# Patient Record
Sex: Male | Born: 1995 | Race: White | Hispanic: No | Marital: Single
Health system: Southern US, Community
[De-identification: ages and names within clinical notes are randomized; demographics above are authoritative.]

---
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
## 2018-03-02 ENCOUNTER — Other Ambulatory Visit: Payer: Self-pay | Admitting: Pediatrics

## 2018-03-02 ENCOUNTER — Ambulatory Visit
Admission: RE | Admit: 2018-03-02 | Discharge: 2018-03-02 | Disposition: A | Discharge status: Home / Self Care | Payer: 59 | Source: Ambulatory Visit | Attending: Pediatrics | Admitting: Pediatrics

## 2018-03-02 DIAGNOSIS — Z0000 Encounter for general adult medical examination without abnormal findings: Secondary | ICD-10-CM

## 2018-03-05 ENCOUNTER — Ambulatory Visit (INDEPENDENT_AMBULATORY_CARE_PROVIDER_SITE_OTHER): Payer: 59 | Admitting: Cardiovascular Disease

## 2018-03-05 VITALS — BP 130/79 | HR 69 | Ht 72.0 in | Wt 135.4 lb

## 2018-03-05 DIAGNOSIS — I459 Conduction disorder, unspecified: Secondary | ICD-10-CM | POA: Diagnosis not present

## 2018-03-05 DIAGNOSIS — R011 Cardiac murmur, unspecified: Secondary | ICD-10-CM | POA: Diagnosis not present

## 2018-03-05 NOTE — Patient Instructions (Signed)
Medication Instructions:  Your physician recommends that you continue on your current medications as directed. Please refer to the Current Medication list given to you today.  If you need a refill on your cardiac medications before your next appointment, please call your pharmacy.   Testing/Procedures: Your physician has requested that you have an echocardiogram. Echocardiography is a painless test that uses sound waves to create images of your heart. It provides your doctor with information about the size and shape of your heart and how well your heart's chambers and valves are working. This procedure takes approximately one hour. There are no restrictions for this procedure.  [facility]:  [facility]  Follow-Up: At [facility], you and your health needs are our priority.  As part of our continuing mission to provide you with exceptional heart care, we have created designated Provider Care Teams.  These Care Teams include your primary Cardiologist (physician) and Advanced Practice Providers (APPs -  Physician Assistants and Nurse Practitioners) who all work together to provide you with the care you need, when you need it. Marland Kitchen. Pending Echo results

## 2018-03-05 NOTE — Progress Notes (Signed)
Cardiology Office Note    Date:  03/06/2018   ID:  Garrett Russo, DOB 06/22/1995, MRN [id number]  PCP:  Bjorn Pippin, MD  Cardiologist:  Nicki Guadalajara, MD   New cardiology consultation referred through the courtesy of Dr. Anner Crete at [facility] pediatrics  History of Present Illness:  Garrett Russo is a 22 y.o. male who is referred through the courtesy of Dr. Vonna Kotyk for evaluation of recently detected cardiac murmur.  Garrett Russo is a 22 year old Caucasian male who denies any awareness of known cardiac disease.  However, [name] to his mother when he was younger doing sports on one occasion he appeared to have a syncopal spell.  He has noticed some episodes of some mild lightheadedness and has not associated any palpitations.  He had graduated from high school last year and was enrolled at [employer].  However, he recently came back home and will not complete the semester or return next semester until certain issues are worked out.  He has a history of vaping for approximately 2 years and while at school was using marijuana 4 times per week as well as having beer often.  Ultimately, the patient has left school to be at home for the next year prior to determining which direction he will be heading.  He was recently evaluated by Dr. Marcha Dutton at [facility] pediatrics and a 1/6 low pitched vibratory holosystolic murmur was heard best at the lower sternal border.  He is now referred for cardiology evaluation.  Remotely he had played football.  While in high school he played lacrosse.  He was able to run without significant difficulty.  He is unaware of any family history of cardiomyopathy or hypertrophic muscle.  He denies any palpitations.  Oftentimes when he felt dizzy he would be breathing rapidly and other times this occurred after periods of significant heat and perspiration suggesting potential dehydration component.  He presents for evaluation   No past medical history on  file.  No past surgical history on file.  Current Medications: Outpatient Medications Prior to Visit  Medication Sig Dispense Refill  . EPINEPHrine 0.3 mg/0.3 mL IJ SOAJ injection Inject into the muscle.    Marland Kitchen FLUoxetine (PROZAC) 20 MG capsule Take 20 mg by mouth daily.    . naproxen (NAPROSYN) 500 MG tablet Take 1 tablet (500 mg total) by mouth 2 (two) times daily. 30 tablet 0   No facility-administered medications prior to visit.      Allergies:   Bee venom  Apparently over the past year he was stung by a bee and developed potential early anaphylaxis and was treated at [facility].  He does not recall being told of having a cardiac murmur at that time.  Social History   Socioeconomic History  . Marital status: Single    Spouse name: Not on file  . Number of children: Not on file  . Years of education: Not on file  . Highest education level: Not on file  Occupational History  . Not on file  Social Needs  . Financial resource strain: Not on file  . Food insecurity:    Worry: Not on file    Inability: Not on file  . Transportation needs:    Medical: Not on file    Non-medical: Not on file  Tobacco Use  . Smoking status: Passive Smoke Exposure - Never Smoker  Substance and Sexual Activity  . Alcohol use: No  . Drug use: No  . Sexual activity: Not  on file  Lifestyle  . Physical activity:    Days per week: Not on file    Minutes per session: Not on file  . Stress: Not on file  Relationships  . Social connections:    Talks on phone: Not on file    Gets together: Not on file    Attends religious service: Not on file    Active member of club or organization: Not on file    Attends meetings of clubs or organizations: Not on file    Relationship status: Not on file  Other Topics Concern  . Not on file  Social History Narrative  . Not on file     Additional social history: He is single.  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  He has been vaping for 2 years.  He drinks beer.  Over the past year he was using marijuana 4 times per week.  There are no other drugs.  Family History:  The patient's family history is not on file.   Family history is notable in that his mother is age 20 and has a benign brain tumor.  His father is age 20.  A sister, age 20 was told of having a heart murmur at age 20.  ROS General: Negative; No fevers, chills, or night sweats;  HEENT: Negative; No changes in vision or hearing, sinus congestion, difficulty swallowing Pulmonary: Negative; No cough, wheezing, shortness of breath, hemoptysis Cardiovascular: Negative; No chest pain, presyncope, syncope, palpitations GI: Negative; No nausea, vomiting, diarrhea, or abdominal pain GU: Negative; No dysuria, hematuria, or difficulty voiding Musculoskeletal: Negative; no myalgias, joint pain, or weakness Hematologic/Oncology: Negative; no easy bruising, bleeding Endocrine: Negative; no heat/cold intolerance; no diabetes Neuro: Negative; no changes in balance, headaches Skin: Negative; No rashes or skin lesions Psychiatric: Negative; No behavioral problems, depression Sleep: Negative; No snoring, daytime sleepiness, hypersomnolence, bruxism, restless legs, hypnogognic hallucinations, no cataplexy Other comprehensive 14 point system review is negative.   PHYSICAL EXAM:   VS:  BP 130/79   Pulse 69   Ht 6' (1.829 m)   Wt 135 lb 6.4 oz (61.4 kg)   BMI 18.36 kg/m     Repeat blood pressure by me was 124/78 supine and 120/76 standing  Wt Readings from Last 3 Encounters:  03/05/18 135 lb 6.4 oz (61.4 kg)    General: Alert, oriented, no distress.  Very thin appearing, straight-back,  Skin: normal turgor, no rashes, warm and dry HEENT: Normocephalic, atraumatic. Pupils equal round and reactive to light; sclera anicteric; extraocular muscles intact; Fundi no hemorrhages or exudates.  No AV nicking.  Disks flat Nose  without nasal septal hypertrophy Mouth/Parynx: High arched palate; Mallinpatti scale 1 Neck: No JVD, no carotid bruits; normal carotid upstroke Lungs: clear to ausculatation and percussion; no wheezing or rales Chest wall: Mild pectus excavatum chest deformity without tenderness to palpitation Heart: PMI not displaced, RRR, s1 s2 normal, 1-2/6 vibratory systolic murmur heard best in the lower sternal border and towards the apex which increases in going from squatting to standing.  Faint trace systolic murmur in the right upper sternal border with suggestion of faint diastolic murmur in the pulmonic area, no definitive systolic click;no rubs, gallops, thrills, or heaves Abdomen: soft, nontender; no hepatosplenomehaly, BS+; abdominal aorta nontender and not dilated by palpation. Back: Very straight back; no CVA tenderness Pulses 2+ Musculoskeletal: full range of motion, normal strength, no joint deformities Extremities: no clubbing  cyanosis or edema, Homan's sign negative  Neurologic: grossly nonfocal; Cranial nerves grossly wnl Psychologic: Normal mood and affect   Studies/Labs Reviewed:   EKG:  EKG is ordered today.  ECG (independently read by me): Normal sinus rhythm at 69 bpm.  Mild RV conduction delay in V1.  Normal QTc interval at 417 ms.  Normal PR interval at 120 ms.   Recent Labs: BMP Latest Ref Rng & Units 05/24/2013  Glucose 70 - 99 mg/dL 89  BUN 6 - 23 mg/dL 3(L)  Creatinine 1.61 - 1.00 mg/dL 0.96  Sodium 045 - 409 mEq/L 143  Potassium 3.7 - 5.3 mEq/L 3.9  Chloride 96 - 112 mEq/L 101     No flowsheet data found.  CBC Latest Ref Rng & Units 05/24/2013 05/24/2013  WBC 4.5 - 13.5 K/uL - 12.4  Hemoglobin 11.0 - 14.6 g/dL 15.3(H) 13.8  Hematocrit 33.0 - 44.0 % 45.0(H) 41.3  Platelets 150 - 400 K/uL - 286   Lab Results  Component Value Date   MCV 85.3 05/24/2013   No results found for: TSH No results found for: HGBA1C   BNP No results found for: BNP  ProBNP No  results found for: PROBNP   Lipid Panel  No results found for: CHOL, TRIG, HDL, CHOLHDL, VLDL, LDLCALC, LDLDIRECT   RADIOLOGY: Dg Chest 2 View  Result Date: 03/05/2018 CLINICAL DATA:  Heart murmur.  General medical exam. EXAM: CHEST - 2 VIEW COMPARISON:  None. FINDINGS: The heart size and mediastinal contours are within normal limits. Both lungs are clear. The visualized skeletal structures are unremarkable. IMPRESSION: Negative.  No active cardiopulmonary disease. Electronically Signed   By: Myles Rosenthal M.D.   On: 03/05/2018 02:58     Additional studies/ records that were reviewed today include:  I reviewed the [place] [facility]   ASSESSMENT:    1. Systolic Murmur   2. RV conduction delay      PLAN:  Garrett Russo is a 22 year old 6 foot 135 pound male who denies any awareness of known cardiac disease or family history of congenital heart abnormalities.  Many years ago while playing football he apparently may have had a syncopal spell when he was overheated and hot.  He has noticed some mild episodes of dizziness more often when in significant heat and sweating but denies any prodrome of tachycardia or bradycardia.  He is tall and lean and has a very high arch palate as well as a pectus excavatum chest deformity.  I suspect he may have mitral valve prolapse.  There is no history of Marfan's.  He denies any issues with vision.  On exam he is not orthostatic.  His ECG reveals normal sinus rhythm with mild RV conduction delay.  I am referring him for a 2D echo Doppler study to evaluate myocardial wall thickness, systolic and diastolic function as well as valvular architecture.  There is no awareness of hypertrophic cardiomyopathy and family members.  I will contact him regarding his echocardiographic findings depending upon the results we will see him back in the office for follow-up evaluation if needed.  I had a long discussion with him regarding the seriousness of vaping  with recent episodes of toxic lung injury and death in teenagers and young.  I also discussed discontinuance of marijuana.  Medication Adjustments/Labs and Tests Ordered: Current medicines are reviewed at length with the patient today.  Concerns regarding medicines are outlined above.  Medication changes, Labs and Tests ordered today are listed in the  Patient Instructions below. Patient Instructions  Medication Instructions:  Your physician recommends that you continue on your current medications as directed. Please refer to the Current Medication list given to you today.  If you need a refill on your cardiac medications before your next appointment, please call your pharmacy.   Testing/Procedures: Your physician has requested that you have an echocardiogram. Echocardiography is a painless test that uses sound waves to create images of your heart. It provides your doctor with information about the size and shape of your heart and how well your heart's chambers and valves are working. This procedure takes approximately one hour. There are no restrictions for this procedure.  This will be done at our [facility] location:  [employer] [address]  Follow-Up: At [facility], you and your health needs are our priority.  As part of our continuing mission to provide you with exceptional heart care, we have created designated Provider Care Teams.  These Care Teams include your primary Cardiologist (physician) and Advanced Practice Providers (APPs -  Physician Assistants and Nurse Practitioners) who all work together to provide you with the care you need, when you need it. Marland Kitchen Pending Echo results        Signed, Nicki Guadalajara, MD  03/06/2018 3:36 PM    [facility] [address], [address], [place], [place]  [address] Phone: [phone]

## 2018-03-06 ENCOUNTER — Encounter: Payer: Self-pay | Admitting: Cardiovascular Disease

## 2018-03-13 ENCOUNTER — Ambulatory Visit (HOSPITAL_COMMUNITY): Payer: 59 | Attending: Cardiology

## 2018-03-13 ENCOUNTER — Other Ambulatory Visit: Payer: Self-pay

## 2018-03-13 DIAGNOSIS — R011 Cardiac murmur, unspecified: Secondary | ICD-10-CM | POA: Insufficient documentation

## 2018-03-13 NOTE — Addendum Note (Signed)
Addended by: Johney FrameSHARPE, Pastor Sgro A on: 03/13/2018 09:02 AM   Modules accepted: Orders

## 2018-04-02 ENCOUNTER — Telehealth: Payer: Self-pay | Admitting: *Deleted

## 2018-04-02 NOTE — Telephone Encounter (Signed)
Attempt to call patient, no answer and VM not set up.    Per Dr. Iran OuchKelly-patient to follow up in 2 years.

## 2018-04-02 NOTE — Telephone Encounter (Signed)
-----   Message from Lennette Biharihomas A Kelly, MD sent at 03/16/2018  8:24 AM EST ----- Normal LV function.  Normal diastolic parameters.  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

## 2018-04-09 NOTE — Telephone Encounter (Signed)
Attempt to call patient, no answer and unable to leave VM (VM not set up)

## 2018-04-13 ENCOUNTER — Encounter: Payer: Self-pay | Admitting: *Deleted

## 2018-04-13 NOTE — Telephone Encounter (Signed)
Attempt to call patient x 3-unable to reach and unable to leave VM.      Letter mailed

## 2018-11-06 ENCOUNTER — Other Ambulatory Visit: Payer: Self-pay

## 2018-11-06 ENCOUNTER — Telehealth: Payer: Self-pay | Admitting: Family

## 2018-11-06 DIAGNOSIS — Z20822 Contact with and (suspected) exposure to covid-19: Secondary | ICD-10-CM

## 2018-11-06 NOTE — Progress Notes (Signed)
E-Visit for Corona Virus Screening   Your current symptoms could be consistent with the coronavirus.  Many health care providers can now test patients at their office but not all are.  [facility] has multiple testing sites. For information on our COVID testing locations and hours go to [email or web address]  Please quarantine yourself while awaiting your test results.  We are enrolling you in our MyChart Home [facility] for COVID19 . Daily you will receive a questionnaire within the MyChart website. Our COVID 19 response team willl be monitoriing your responses daily.    COVID-19 is a respiratory illness with symptoms that are similar to the flu. Symptoms are typically mild to moderate, but there have been cases of severe illness and death due to the virus. The following symptoms may appear 2-14 days after exposure: . Fever . Cough . Shortness of breath or difficulty breathing . Chills . Repeated shaking with chills . Muscle pain . Headache . Sore throat . New loss of taste or smell . Fatigue . Congestion or runny nose . Nausea or vomiting . Diarrhea  It is vitally important that if you feel that you have an infection such as this virus or any other virus that you stay home and away from places where you may spread it to others.  You should self-quarantine for 14 days if you have symptoms that could potentially be coronavirus or have been in close contact a with a person diagnosed with COVID-19 within the last 2 weeks. You should avoid contact with people age 65 and older.   You should wear a mask or cloth face covering over your nose and mouth if you must be around other people or animals, including pets (even at home). Try to stay at least 6 feet away from other people. This will protect the people around you.  You may also take acetaminophen (Tylenol) as needed for fever.   Reduce your risk of any infection by using the same precautions used for avoiding the  common cold or flu:  . Wash your hands often with soap and warm water for at least 20 seconds.  If soap and water are not readily available, use an alcohol-based hand sanitizer with at least 60% alcohol.  . If coughing or sneezing, cover your mouth and nose by coughing or sneezing into the elbow areas of your shirt or coat, into a tissue or into your sleeve (not your hands). . Avoid shaking hands with others and consider head nods or verbal greetings only. . Avoid touching your eyes, nose, or mouth with unwashed hands.  . Avoid close contact with people who are sick. . Avoid places or events with large numbers of people in one location, like concerts or sporting events. . Carefully consider travel plans you have or are making. . If you are planning any travel outside or inside the [place], visit the CDC's Travelers' Health webpage for the latest health notices. . If you have some symptoms but not all symptoms, continue to monitor at home and seek medical attention if your symptoms worsen. . If you are having a medical emergency, call 911.  HOME CARE . Only take medications as instructed by your medical team. . Drink plenty of fluids and get plenty of rest. . A steam or ultrasonic humidifier can help if you have congestion.   GET HELP RIGHT AWAY IF YOU HAVE EMERGENCY WARNING SIGNS** FOR COVID-19. If you or someone is showing any of these signs seek emergency medical care immediately. Call   911 or proceed to your closest emergency facility if: . You develop worsening high fever. . Trouble breathing . Bluish lips or face . Persistent pain or pressure in the chest . New confusion . Inability to wake or stay awake . You cough up blood. . Your symptoms become more severe  **This list is not all possible symptoms. Contact your medical provider for any symptoms that are sever or concerning to you.   MAKE SURE YOU   Understand these instructions.  Will watch your condition.  Will get help right  away if you are not doing well or get worse.  Your e-visit answers were reviewed by a board certified advanced clinical practitioner to complete your personal care plan.  Depending on the condition, your plan could have included both over the counter or prescription medications.  If there is a problem please reply once you have received a response from your provider.  Your safety is important to us.  If you have drug allergies check your prescription carefully.    You can use MyChart to ask questions about today's visit, request a non-urgent call back, or ask for a work or school excuse for 24 hours related to this e-Visit. If it has been greater than 24 hours you will need to follow up with your provider, or enter a new e-Visit to address those concerns. You will get an e-mail in the next two days asking about your experience.  I hope that your e-visit has been valuable and will speed your recovery. Thank you for using e-visits.   Greater than 5 minutes, yet less than 10 minutes of time have been spent researching, coordinating, and implementing care for this patient today.  Thank you for the details you included in the comment boxes. Those details are very helpful in determining the best course of treatment for you and help us to provide the best care.  

## 2018-11-07 ENCOUNTER — Other Ambulatory Visit: Payer: Self-pay

## 2018-11-07 ENCOUNTER — Emergency Department (HOSPITAL_COMMUNITY): Admission: EM | Admit: 2018-11-07 | Discharge: 2018-11-07 | Discharge status: Absent without leave | Payer: Self-pay

## 2018-11-08 LAB — NOVEL CORONAVIRUS, NAA: SARS-CoV-2, NAA: NOT DETECTED

## 2019-07-20 ENCOUNTER — Ambulatory Visit: Payer: Self-pay | Attending: Internal Medicine

## 2019-07-20 DIAGNOSIS — Z23 Encounter for immunization: Secondary | ICD-10-CM

## 2019-07-20 NOTE — Progress Notes (Signed)
   Covid-19 Vaccination Clinic  Name:  YEHYA BRENDLE    MRN: [id number] DOB: 05-29-1995  07/20/2019  Mr. Osten was observed post Covid-19 immunization for 15 minutes without incident. He was provided with Vaccine Information Sheet and instruction to access the V-Safe system.   Mr. Anding was instructed to call 911 with any severe reactions post vaccine: Marland Kitchen Difficulty breathing  . Swelling of face and throat  . A fast heartbeat  . A bad rash all over body  . Dizziness and weakness   Immunizations Administered    Name Date Dose VIS Date Route   Pfizer COVID-19 Vaccine 07/20/2019 10:42 AM 0.3 mL 03/22/2019 Intramuscular   Manufacturer: [employer], [employer]   Lot: [id number]   NDC: [address]-5

## 2019-08-12 ENCOUNTER — Ambulatory Visit: Payer: Self-pay | Attending: Internal Medicine

## 2019-08-12 DIAGNOSIS — Z23 Encounter for immunization: Secondary | ICD-10-CM

## 2019-08-12 NOTE — Progress Notes (Signed)
   Covid-19 Vaccination Clinic  Name:  Garrett Russo    MRN: [id number] DOB: 1995/07/03  08/12/2019  [name] was observed post Covid-19 immunization for 30 minutes based on pre-vaccination screening without incident. He was provided with Vaccine Information Sheet and instruction to access the V-Safe system.   [name] was instructed to call 911 with any severe reactions post vaccine: Marland Kitchen Difficulty breathing  . Swelling of face and throat  . A fast heartbeat  . A bad rash all over body  . Dizziness and weakness   Immunizations Administered    Name Date Dose VIS Date Route   Pfizer COVID-19 Vaccine 08/12/2019  3:50 PM 0.3 mL 06/05/2018 Intramuscular   Manufacturer: [employer], [employer]   Lot: [id number]   NDC: [address]-6

## 2019-09-03 IMAGING — DX DG CHEST 2V
2 series · 2 of 2 positions shown · non-contrast
Comparison: None.

CLINICAL DATA: Heart murmur.  General medical exam.

EXAM:
CHEST - 2 VIEW

[dg chest 2 view (1 of 2)]
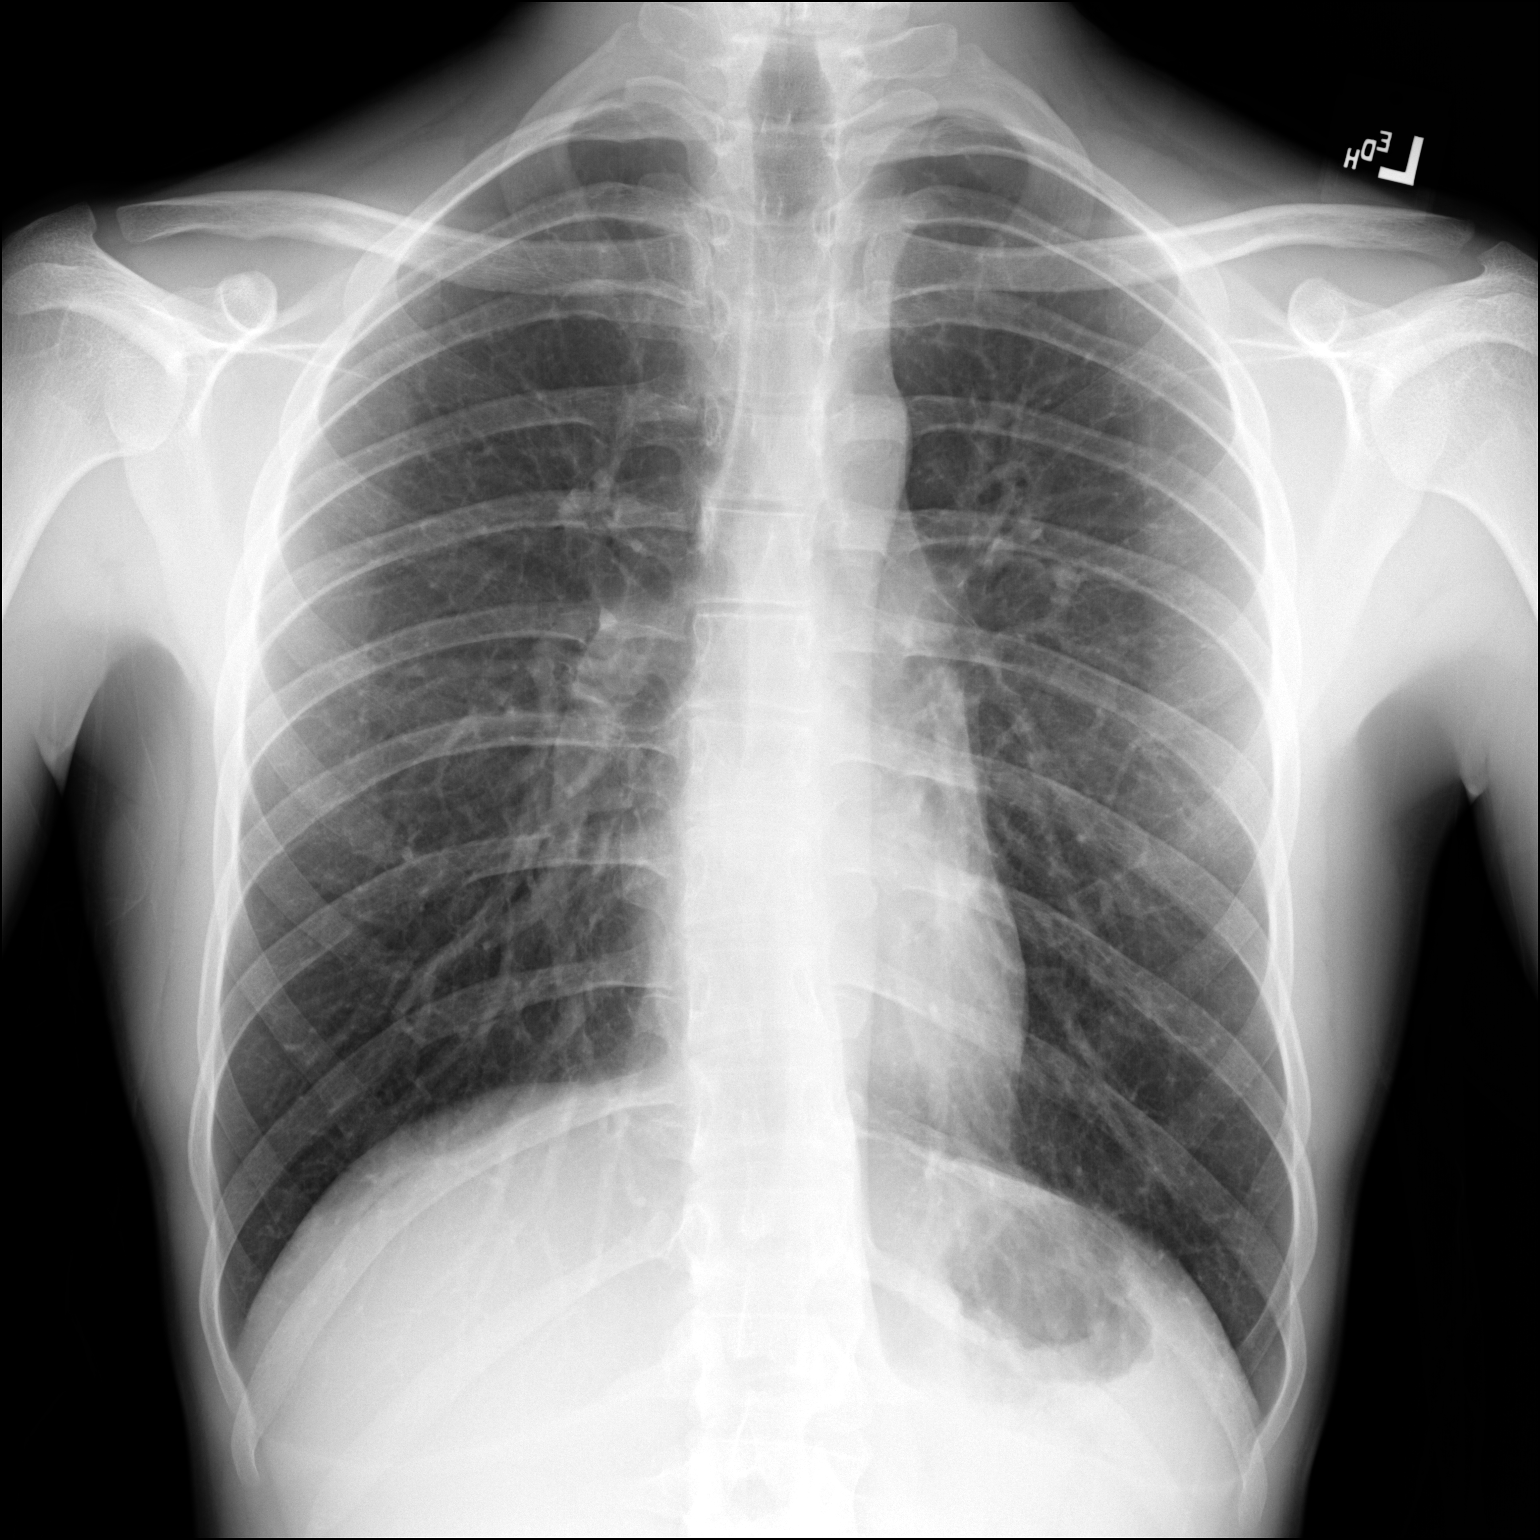

[dg chest 2 view (2 of 2)]
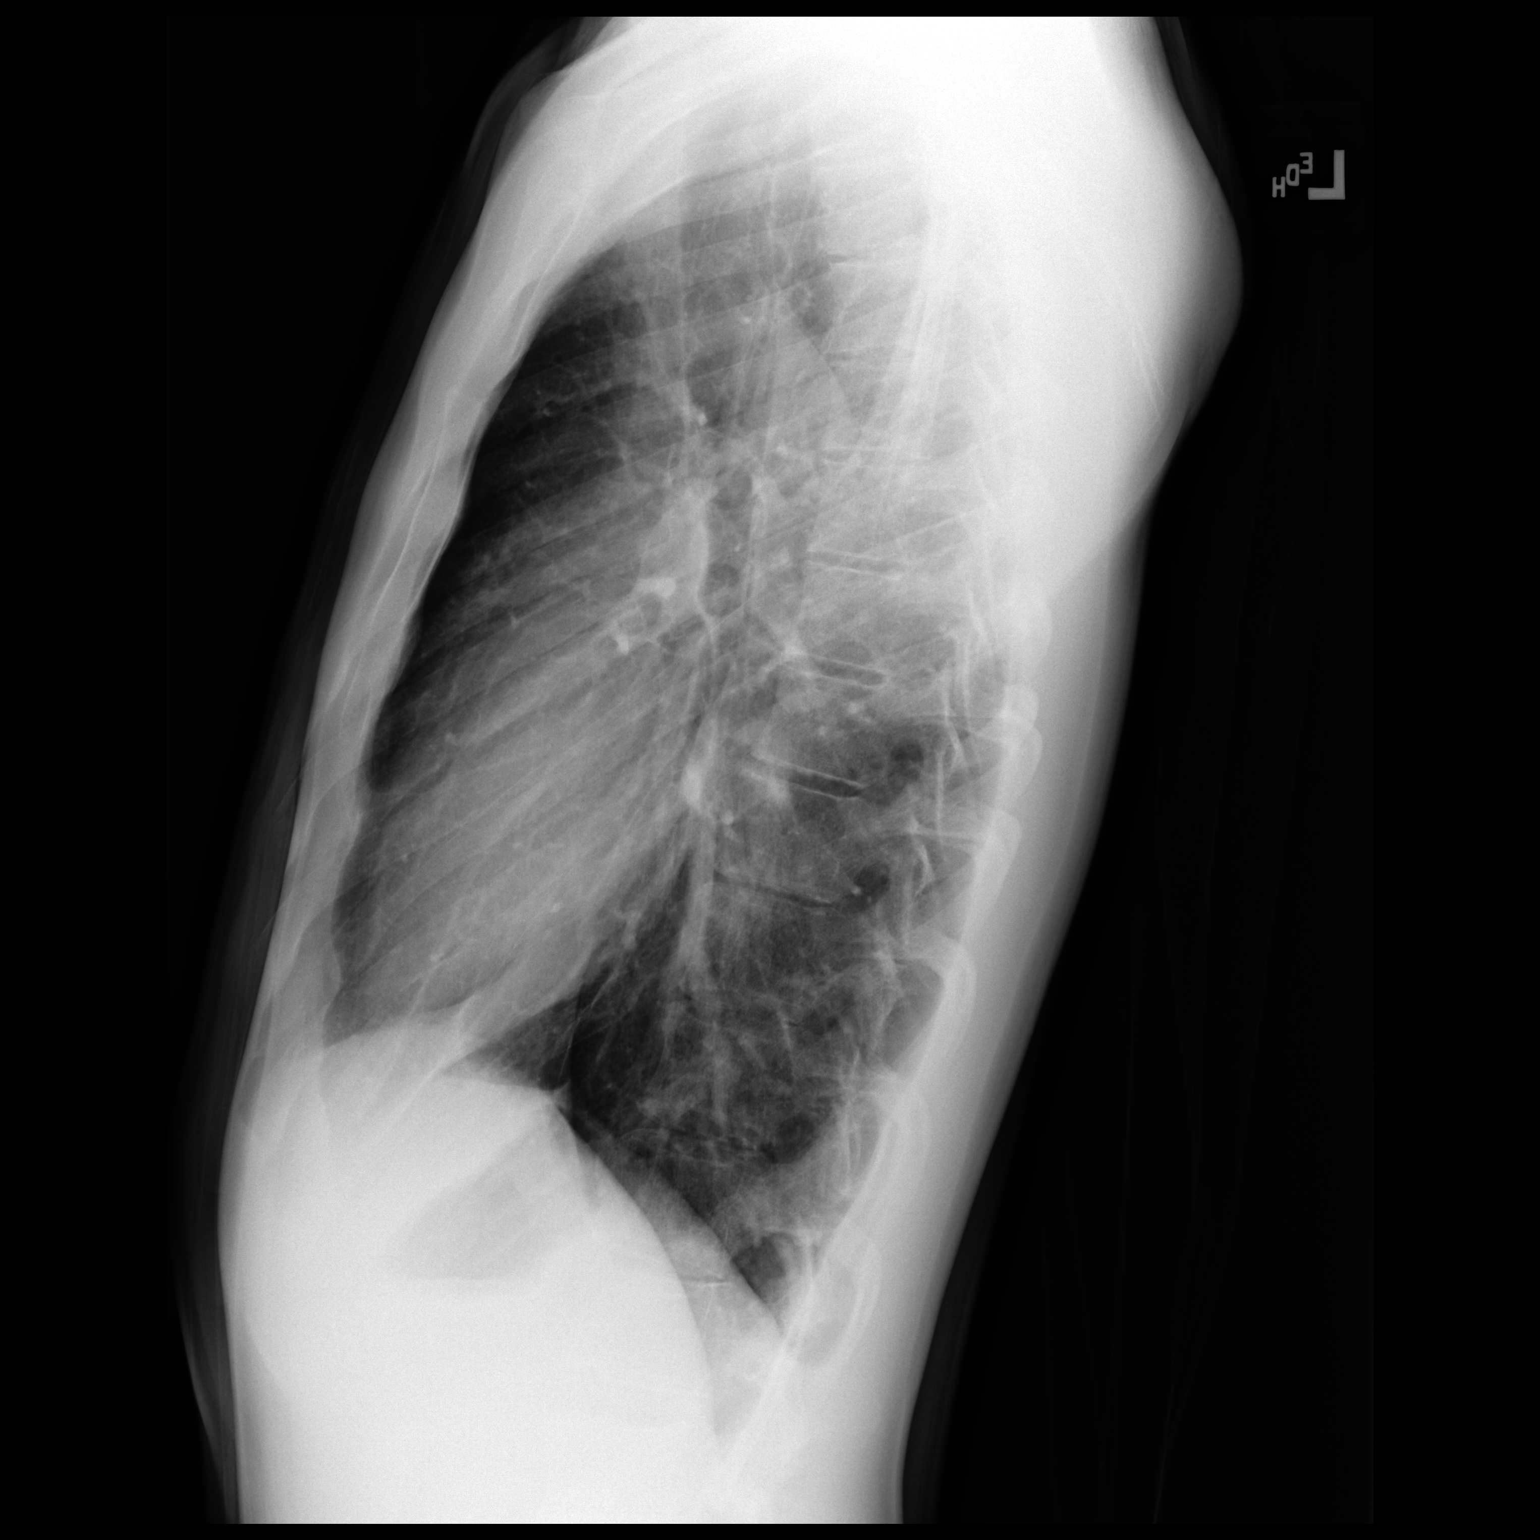

[2 of 2 positions shown; findings below may reference images not displayed]

FINDINGS: The heart size and mediastinal contours are within normal limits.
Both lungs are clear. The visualized skeletal structures are
unremarkable.
IMPRESSION: Negative.  No active cardiopulmonary disease.
